# Patient Record
Sex: Female | Born: 2005 | Race: Black or African American | Hispanic: No | Marital: Single | Smoking: Never smoker
Health system: Southern US, Community
[De-identification: ages and names within clinical notes are randomized; demographics above are authoritative.]

## PROBLEM LIST (undated history)

## (undated) ENCOUNTER — Emergency Department (HOSPITAL_COMMUNITY): Admission: EM | Payer: 59 | Source: Home / Self Care

## (undated) DIAGNOSIS — R1012 Left upper quadrant pain: Secondary | ICD-10-CM

## (undated) HISTORY — DX: Left upper quadrant pain: R10.12

---
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
## 2024-02-08 ENCOUNTER — Encounter (HOSPITAL_COMMUNITY): Payer: Self-pay | Admitting: Emergency Medicine

## 2024-02-08 ENCOUNTER — Other Ambulatory Visit: Payer: Self-pay

## 2024-02-08 ENCOUNTER — Emergency Department (HOSPITAL_COMMUNITY)
Admission: EM | Admit: 2024-02-08 | Discharge: 2024-02-08 | Discharge status: Against Medical Advice | Attending: Emergency Medicine | Admitting: Emergency Medicine

## 2024-02-08 DIAGNOSIS — Z5321 Procedure and treatment not carried out due to patient leaving prior to being seen by health care provider: Secondary | ICD-10-CM | POA: Diagnosis not present

## 2024-02-08 DIAGNOSIS — N946 Dysmenorrhea, unspecified: Secondary | ICD-10-CM | POA: Diagnosis not present

## 2024-02-08 DIAGNOSIS — R55 Syncope and collapse: Secondary | ICD-10-CM | POA: Diagnosis present

## 2024-02-08 NOTE — ED Notes (Signed)
 Patient identified to staff member that she was leaving department prior to being seen by MD.  IV removed by staff member prior to patient leaving.  This RN was unable to speak to patient prior to her departure

## 2024-02-08 NOTE — ED Triage Notes (Signed)
 Patient coming to ED for evaluation of syncopal episode due to menstrual pain.  Reports prior syncopal episode have been a result of anxiety.  Given 200 mL NS PTA with improvement in symptoms.   CBG: 148 BP: 122/68 HR: 100 SpO2: 100%
